# Patient Record
Sex: Female | Born: 2000 | Hispanic: Yes | Marital: Single | Smoking: Former smoker
Health system: Southern US, Community
[De-identification: ages and names within clinical notes are randomized; demographics above are authoritative.]

## PROBLEM LIST (undated history)

## (undated) ENCOUNTER — Inpatient Hospital Stay (HOSPITAL_COMMUNITY): Payer: Self-pay

## (undated) DIAGNOSIS — O039 Complete or unspecified spontaneous abortion without complication: Secondary | ICD-10-CM

## (undated) HISTORY — PX: THROAT SURGERY: SHX803

## (undated) HISTORY — PX: TONSILLECTOMY: SUR1361

---
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
## 2019-02-04 ENCOUNTER — Encounter (HOSPITAL_BASED_OUTPATIENT_CLINIC_OR_DEPARTMENT_OTHER): Payer: Self-pay | Admitting: *Deleted

## 2019-02-04 ENCOUNTER — Other Ambulatory Visit: Payer: Self-pay

## 2019-02-04 ENCOUNTER — Emergency Department (HOSPITAL_BASED_OUTPATIENT_CLINIC_OR_DEPARTMENT_OTHER): Payer: Commercial Managed Care - PPO

## 2019-02-04 ENCOUNTER — Emergency Department (HOSPITAL_BASED_OUTPATIENT_CLINIC_OR_DEPARTMENT_OTHER)
Admission: EM | Admit: 2019-02-04 | Discharge: 2019-02-04 | Disposition: A | Discharge status: Home / Self Care | Payer: Commercial Managed Care - PPO | Attending: Emergency Medicine | Admitting: Emergency Medicine

## 2019-02-04 DIAGNOSIS — O4691 Antepartum hemorrhage, unspecified, first trimester: Secondary | ICD-10-CM | POA: Diagnosis present

## 2019-02-04 DIAGNOSIS — Z3A01 Less than 8 weeks gestation of pregnancy: Secondary | ICD-10-CM | POA: Insufficient documentation

## 2019-02-04 DIAGNOSIS — O209 Hemorrhage in early pregnancy, unspecified: Secondary | ICD-10-CM

## 2019-02-04 DIAGNOSIS — Z79899 Other long term (current) drug therapy: Secondary | ICD-10-CM | POA: Insufficient documentation

## 2019-02-04 DIAGNOSIS — Z8759 Personal history of other complications of pregnancy, childbirth and the puerperium: Secondary | ICD-10-CM | POA: Diagnosis not present

## 2019-02-04 DIAGNOSIS — O4690 Antepartum hemorrhage, unspecified, unspecified trimester: Secondary | ICD-10-CM

## 2019-02-04 HISTORY — DX: Complete or unspecified spontaneous abortion without complication: O03.9

## 2019-02-04 LAB — WET PREP, GENITAL
Clue Cells Wet Prep HPF POC: NONE SEEN
Sperm: NONE SEEN
Trich, Wet Prep: NONE SEEN
Yeast Wet Prep HPF POC: NONE SEEN

## 2019-02-04 LAB — URINALYSIS, MICROSCOPIC (REFLEX)

## 2019-02-04 LAB — CBC
HCT: 38.9 % (ref 36.0–46.0)
Hemoglobin: 12.3 g/dL (ref 12.0–15.0)
MCH: 27.4 pg (ref 26.0–34.0)
MCHC: 31.6 g/dL (ref 30.0–36.0)
MCV: 86.6 fL (ref 80.0–100.0)
Platelets: 191 10*3/uL (ref 150–400)
RBC: 4.49 MIL/uL (ref 3.87–5.11)
RDW: 13.3 % (ref 11.5–15.5)
WBC: 7.3 10*3/uL (ref 4.0–10.5)
nRBC: 0 % (ref 0.0–0.2)

## 2019-02-04 LAB — COMPREHENSIVE METABOLIC PANEL
ALT: 20 U/L (ref 0–44)
AST: 16 U/L (ref 15–41)
Albumin: 3.7 g/dL (ref 3.5–5.0)
Alkaline Phosphatase: 34 U/L — ABNORMAL LOW (ref 38–126)
Anion gap: 10 (ref 5–15)
BUN: 16 mg/dL (ref 6–20)
CO2: 23 mmol/L (ref 22–32)
Calcium: 8.9 mg/dL (ref 8.9–10.3)
Chloride: 106 mmol/L (ref 98–111)
Creatinine, Ser: 0.77 mg/dL (ref 0.44–1.00)
GFR calc Af Amer: >60 mL/min (ref >60)
GFR calc non Af Amer: >60 mL/min (ref >60)
Glucose, Bld: 100 mg/dL — ABNORMAL HIGH (ref 70–99)
Potassium: 3.1 mmol/L — ABNORMAL LOW (ref 3.5–5.1)
Sodium: 139 mmol/L (ref 135–145)
Total Bilirubin: 0.4 mg/dL (ref 0.3–1.2)
Total Protein: 6.5 g/dL (ref 6.5–8.1)

## 2019-02-04 LAB — URINALYSIS, ROUTINE W REFLEX MICROSCOPIC
Bilirubin Urine: NEGATIVE
Glucose, UA: NEGATIVE mg/dL
Ketones, ur: NEGATIVE mg/dL
Leukocytes,Ua: NEGATIVE
Nitrite: NEGATIVE
Protein, ur: NEGATIVE mg/dL
Specific Gravity, Urine: >1.03 — ABNORMAL HIGH (ref 1.005–1.030)
pH: 6 (ref 5.0–8.0)

## 2019-02-04 LAB — PREGNANCY, URINE: Preg Test, Ur: POSITIVE — AB

## 2019-02-04 LAB — HCG, QUANTITATIVE, PREGNANCY: hCG, Beta Chain, Quant, S: 917 m[IU]/mL — ABNORMAL HIGH (ref <5)

## 2019-02-04 NOTE — ED Notes (Signed)
Patient transported to Ultrasound 

## 2019-02-04 NOTE — Discharge Instructions (Signed)
Your vaginal bleeding could be normal vaginal bleeding, or a spontaneous abortion.  You will need to follow-up with a OB/GYN within the next 2 days so they can perform further work-up to see if you still have a viable pregnancy.  In the meantime if you are experiencing worsening bleeding, fever, abdominal pain these would be reason to come back to the emergency department or talk to your OB/GYN.

## 2019-02-04 NOTE — ED Provider Notes (Signed)
[facility] EMERGENCY DEPARTMENT Provider Note   CSN: [id number] Arrival date & time: 02/04/19  1802    History   Chief Complaint Chief Complaint  Patient presents with   Vaginal Bleeding    HPI Lindsey Blanchard is a 18 y.o. female.     Patient was an 18 year old female who [facility] because she had been 1 week late on her menstrual cycle and was found to have a positive pregnancy test.  Today she noticed some blood on her toilet paper when she use the bathroom so she came to the emergency department.  In the emergency department when giving a sample of urine she noted blood on the toilet paper again.  She denies abdominal pain, dysuria, vaginal discharge, flank pain, fever/chills, vomiting.  She does endorse a month to 2 months of "nausea and weakness".  Patient has been trying to get pregnant and currently is in a relationship with her boyfriend whom she lives with.  She had a spontaneous abortion in November of last year.  These are the only 2 times she has been pregnant.     Past Medical History:  Diagnosis Date   Miscarriage     Patient Active Problem List   Diagnosis Date Noted   Missed abortion 06/23/2018    Past Surgical History:  Procedure Laterality Date   THROAT SURGERY     TONSILLECTOMY       OB History    Gravida  1   Para      Term      Preterm      AB      Living        SAB      TAB      Ectopic      Multiple      Live Births               Home Medications    Prior to Admission medications   Medication Sig Start Date End Date Taking? Authorizing Provider  ibuprofen (ADVIL,MOTRIN) 800 MG tablet Take 1 tablet (800 mg total) by mouth 3 (three) times daily. 06/09/18   Wende Mott, CNM  norgestimate-ethinyl estradiol (ORTHO-CYCLEN,SPRINTEC,PREVIFEM) 0.25-35 MG-MCG tablet Take 1 tablet by mouth daily. 06/30/18   Starr Lake, CNM  oxyCODONE-acetaminophen  (PERCOCET/ROXICET) 5-325 MG tablet Take 2 tablets by mouth every 4 (four) hours as needed for severe pain. Patient not taking: Reported on 06/30/2018 06/09/18   Wende Mott, CNM  Prenatal Vit-Fe Fumarate-FA (PRENATAL MULTIVITAMIN) TABS tablet Take 1 tablet by mouth daily at 12 noon.    [provider]    Family History Family History  Problem Relation Age of Onset   Diabetes Mother     Social History Social History   Tobacco Use   Smoking status: Former Smoker   Smokeless tobacco: Never Used   Tobacco comment: quit with pregnancy  Substance Use Topics   Alcohol use: Never    Frequency: Never   Drug use: Never     Allergies   Patient has no known allergies.   Review of Systems Review of Systems  Constitutional: Negative for chills and fever.  HENT: Negative for sore throat.   Eyes: Negative for visual disturbance.  Respiratory: Negative for cough and shortness of breath.   Cardiovascular: Positive for chest pain.  Gastrointestinal: Positive for nausea. Negative for abdominal pain, diarrhea and vomiting.  Genitourinary: Positive for vaginal bleeding. Negative for dysuria,  genital sores, pelvic pain, vaginal discharge and vaginal pain.  Musculoskeletal: Negative for neck pain.  Skin: Negative for rash.  Neurological: Positive for weakness. Negative for dizziness and syncope.  Psychiatric/Behavioral: Negative for dysphoric mood.     Physical Exam Updated Vital Signs BP 121/62 (BP Location: Right Arm)    Pulse 78    Temp 98.7 F (37.1 C) (Oral)    Resp 18    Ht 5\' 5"  (1.651 m)    Wt 124.3 kg    LMP 12/23/2017    SpO2 100%    BMI 45.60 kg/m   Physical Exam Constitutional:      General: She is not in acute distress.    Appearance: Normal appearance. She is obese. She is not ill-appearing.  HENT:     Head: Normocephalic and atraumatic.     Nose: Nose normal. No congestion or rhinorrhea.     Mouth/Throat:     Mouth: Mucous membranes are moist.      Pharynx: Oropharynx is clear. No oropharyngeal exudate.  Eyes:     Extraocular Movements: Extraocular movements intact.     Conjunctiva/sclera: Conjunctivae normal.     Pupils: Pupils are equal, round, and reactive to light.  Neck:     Musculoskeletal: Normal range of motion and neck supple.  Cardiovascular:     Rate and Rhythm: Normal rate and regular rhythm.     Pulses: Normal pulses.     Heart sounds: No murmur.  Pulmonary:     Effort: Pulmonary effort is normal. No respiratory distress.     Breath sounds: Normal breath sounds. No wheezing.  Abdominal:     General: Bowel sounds are normal. There is no distension.     Palpations: There is no mass.     Tenderness: There is no abdominal tenderness. There is no right CVA tenderness, left CVA tenderness or guarding.     Comments: Large pannus  Genitourinary:    Vagina: No vaginal discharge.     Comments: Blood in the vaginal vault.  But appears to be coming from the cervical loss which appears to be closed. Musculoskeletal:        General: No swelling or tenderness.  Skin:    General: Skin is warm and dry.     Coloration: Skin is not pale.  Neurological:     General: No focal deficit present.     Mental Status: She is alert and oriented to person, place, and time.     Cranial Nerves: No cranial nerve deficit.     Motor: No weakness.  Psychiatric:        Mood and Affect: Mood normal.        Behavior: Behavior normal.      ED Treatments / Results  Labs (all labs ordered are listed, but only abnormal results are displayed) Labs Reviewed  WET PREP, GENITAL - Abnormal; Notable for the following components:      Result Value   WBC, Wet Prep HPF POC FEW (*)    All other components within normal limits  URINALYSIS, ROUTINE W REFLEX MICROSCOPIC - Abnormal; Notable for the following components:   Specific Gravity, Urine >1.030 (*)    Hgb urine dipstick LARGE (*)    All other components within normal limits  PREGNANCY, URINE -  Abnormal; Notable for the following components:   Preg Test, Ur POSITIVE (*)    All other components within normal limits  URINALYSIS, MICROSCOPIC (REFLEX) - Abnormal; Notable for the following components:  Bacteria, UA FEW (*)    All other components within normal limits  HCG, QUANTITATIVE, PREGNANCY - Abnormal; Notable for the following components:   hCG, Beta Chain, Quant, S 917 (*)    All other components within normal limits  COMPREHENSIVE METABOLIC PANEL - Abnormal; Notable for the following components:   Potassium 3.1 (*)    Glucose, Bld 100 (*)    Alkaline Phosphatase 34 (*)    All other components within normal limits  CBC  GC/CHLAMYDIA PROBE AMP ([place]) NOT AT [facility]    EKG None  Radiology [place] Ob Comp < 14 Wks  Result Date: 02/04/2019 CLINICAL DATA:  Vaginal bleeding EXAM: OBSTETRIC <14 WK US AND TRANSVAGINAL OB US TECHNIQUE: Both transabdominal and transvaginal ultrasound examinations were performed for complete evaluation of the gestation as well as the maternal uterus, adnexal regions, and pelvic cul-de-sac. Transvaginal technique was performed to assess early pregnancy. COMPARISON:  None. FINDINGS: Intrauterine gestational sac: Single Yolk sac:  Not visualized Embryo:  Not visualized Cardiac Activity: Not visualized Heart Rate:   bpm MSD: 2.9 mm   5 w   0 d CRL:    mm    w    d                  US EDC: Subchorionic hemorrhage:  None visualized. Maternal uterus/adnexae: No adnexal mass or free fluid. IMPRESSION: Probable early intrauterine gestational sac, but no yolk sac, fetal pole, or cardiac activity yet visualized. Recommend follow-up quantitative B-HCG levels and follow-up US in 14 days to assess viability. This recommendation follows SRU consensus guidelines: Diagnostic Criteria for Nonviable Pregnancy Early in the First Trimester. Malva Limes Engl J Med 2013; 161:0960-45; 369:1443-51. Electronically Signed   By: Charlett NoseKevin  Dover M.D.   On: 02/04/2019 21:12   [place] Ob Transvaginal  Result Date:  02/04/2019 CLINICAL DATA:  Vaginal bleeding EXAM: OBSTETRIC <14 WK US AND TRANSVAGINAL OB US TECHNIQUE: Both transabdominal and transvaginal ultrasound examinations were performed for complete evaluation of the gestation as well as the maternal uterus, adnexal regions, and pelvic cul-de-sac. Transvaginal technique was performed to assess early pregnancy. COMPARISON:  None. FINDINGS: Intrauterine gestational sac: Single Yolk sac:  Not visualized Embryo:  Not visualized Cardiac Activity: Not visualized Heart Rate:   bpm MSD: 2.9 mm   5 w   0 d CRL:    mm    w    d                  US EDC: Subchorionic hemorrhage:  None visualized. Maternal uterus/adnexae: No adnexal mass or free fluid. IMPRESSION: Probable early intrauterine gestational sac, but no yolk sac, fetal pole, or cardiac activity yet visualized. Recommend follow-up quantitative B-HCG levels and follow-up US in 14 days to assess viability. This recommendation follows SRU consensus guidelines: Diagnostic Criteria for Nonviable Pregnancy Early in the First Trimester. Malva Limes Engl J Med 2013; 409:8119-14; 369:1443-51. Electronically Signed   By: Charlett NoseKevin  Dover M.D.   On: 02/04/2019 21:12    Procedures Procedures (including critical care time)  Medications Ordered in ED Medications - No data to display   Initial Impression / Assessment and Plan / ED Course  I have reviewed the triage vital signs and the nursing notes.  Pertinent labs & imaging results that were available during my care of the patient were reviewed by me and considered in my medical decision making (see chart for details).        Patient is an 18 year old female who presented  to the emergency department after noticing some blood on the toilet paper earlier this afternoon.  She had a positive pregnancy test 1 week ago.  She denies any abdominal pain, nausea, vomiting or fevers.  No vaginal discharge, no flank pain.  Patient not complaining of dysuria.  Her vital signs were within normal limits and  stable.  Abdominal exam was benign.  Vaginal exam showed small amounts of blood in the vaginal vault, and blood at the cervical loss, which appeared closed.  Beta-hCG was 917, CBC was normal, CMP did not show any electrolyte disturbance or liver dysfunction.  Wet prep and urinalysis was negative for infection.  Transvaginal ultrasound showed what was likely an intrauterine gestational sac, but without yolk sac or pole.  Based on this information I cannot say for certain if this is normal bleeding in early pregnancy or at the beginning of a spontaneous abortion, but it does appear that it is not a ectopic pregnancy based on ultrasound and physical exam.  Patient does not currently have an obstetrician.  Advised patient to try to find a obstetrician so that she can follow-up in 2 days with a repeat beta hCG quant to help determine if pregnancy is viable.  Gave patient information of some local obstetricians and advised patient that [facility] the beta-hCG measurement as 2 days from now would be a Saturday and many offices would likely be closed.  Advised patient to come back if she starts developing abdominal pain, fever, worsening of her bleeding.  Final Clinical Impressions(s) / ED Diagnoses   Final diagnoses:  Vaginal bleeding in pregnancy    ED Discharge Orders    None       Sandre Kittylson, Kortnee Bas K, MD 02/05/19 1049    Alvira MondaySchlossman, Erin, MD 02/06/19 [phone]

## 2019-02-04 NOTE — ED Notes (Signed)
Pt states scant amount of  Vaginal  bleeding.

## 2019-02-04 NOTE — ED Triage Notes (Signed)
Pt c/o vaginal bleeding x 1 hr, [facility] .

## 2019-02-05 ENCOUNTER — Encounter (HOSPITAL_COMMUNITY): Payer: Self-pay | Admitting: *Deleted

## 2019-02-05 ENCOUNTER — Inpatient Hospital Stay (HOSPITAL_COMMUNITY)
Admission: AD | Admit: 2019-02-05 | Discharge: 2019-02-05 | Disposition: A | Discharge status: Home / Self Care | Payer: Commercial Managed Care - PPO | Attending: Obstetrics and Gynecology | Admitting: Obstetrics and Gynecology

## 2019-02-05 ENCOUNTER — Inpatient Hospital Stay (HOSPITAL_COMMUNITY): Payer: Commercial Managed Care - PPO

## 2019-02-05 DIAGNOSIS — Z3A01 Less than 8 weeks gestation of pregnancy: Secondary | ICD-10-CM | POA: Diagnosis not present

## 2019-02-05 DIAGNOSIS — Z87891 Personal history of nicotine dependence: Secondary | ICD-10-CM | POA: Diagnosis not present

## 2019-02-05 DIAGNOSIS — R109 Unspecified abdominal pain: Secondary | ICD-10-CM | POA: Diagnosis not present

## 2019-02-05 DIAGNOSIS — M549 Dorsalgia, unspecified: Secondary | ICD-10-CM | POA: Diagnosis not present

## 2019-02-05 DIAGNOSIS — O209 Hemorrhage in early pregnancy, unspecified: Secondary | ICD-10-CM | POA: Insufficient documentation

## 2019-02-05 DIAGNOSIS — O26891 Other specified pregnancy related conditions, first trimester: Secondary | ICD-10-CM | POA: Insufficient documentation

## 2019-02-05 DIAGNOSIS — O3680X0 Pregnancy with inconclusive fetal viability, not applicable or unspecified: Secondary | ICD-10-CM | POA: Insufficient documentation

## 2019-02-05 LAB — URINALYSIS, ROUTINE W REFLEX MICROSCOPIC
Bilirubin Urine: NEGATIVE
Glucose, UA: NEGATIVE mg/dL
Ketones, ur: NEGATIVE mg/dL
Leukocytes,Ua: NEGATIVE
Nitrite: NEGATIVE
Protein, ur: 30 mg/dL — AB
RBC / HPF: >50 RBC/hpf — ABNORMAL HIGH (ref 0–5)
Specific Gravity, Urine: 1.028 (ref 1.005–1.030)
pH: 6 (ref 5.0–8.0)

## 2019-02-05 LAB — CBC
HCT: 38.5 % (ref 36.0–46.0)
Hemoglobin: 12.3 g/dL (ref 12.0–15.0)
MCH: 27.3 pg (ref 26.0–34.0)
MCHC: 31.9 g/dL (ref 30.0–36.0)
MCV: 85.4 fL (ref 80.0–100.0)
Platelets: 200 10*3/uL (ref 150–400)
RBC: 4.51 MIL/uL (ref 3.87–5.11)
RDW: 13.3 % (ref 11.5–15.5)
WBC: 6.2 10*3/uL (ref 4.0–10.5)
nRBC: 0 % (ref 0.0–0.2)

## 2019-02-05 LAB — HCG, QUANTITATIVE, PREGNANCY: hCG, Beta Chain, Quant, S: 1014 m[IU]/mL — ABNORMAL HIGH (ref <5)

## 2019-02-05 NOTE — MAU Note (Signed)
.   Lindsey Blanchard is a 18 y.o. at 109w2d here in MAU reporting:  Bright red vaginal bleeding, states she went to [facility] yesterday and they did an U/S but did not tell her anything. Reports lower abdominal cramping and constant back pain Onset of complaint: Yesterday at 5pm Pain score: 8 Vitals:   02/05/19 1616  BP: 125/73  Pulse: 75  Resp: 18  Temp: 97.8 F (36.6 C)  SpO2: 100%      Lab orders placed from triage: UA

## 2019-02-05 NOTE — Discharge Instructions (Signed)
Vaginal Bleeding During Pregnancy, First Trimester ° °A small amount of bleeding from the vagina (spotting) is relatively common during early pregnancy. It usually stops on its own. Various things may cause bleeding or spotting during early pregnancy. Some bleeding may be related to the pregnancy, and some may not. In many cases, the bleeding is normal and is not a problem. However, bleeding can also be a sign of something serious. Be sure to tell your health care provider about any vaginal bleeding right away. °Some possible causes of vaginal bleeding during the first trimester include: °· Infection or inflammation of the cervix. °· Growths (polyps) on the cervix. °· Miscarriage or threatened miscarriage. °· Pregnancy tissue developing outside of the uterus (ectopic pregnancy). °· A mass of tissue developing in the uterus due to an egg being fertilized incorrectly (molar pregnancy). °Follow these instructions at home: °Activity °· Follow instructions from your health care provider about limiting your activity. Ask what activities are safe for you. °· If needed, make plans for someone to help with your regular activities. °· Do not have sex or orgasms until your health care provider says that this is safe. °General instructions °· Take over-the-counter and prescription medicines only as told by your health care provider. °· Pay attention to any changes in your symptoms. °· Do not use tampons or douche. °· Write down how many pads you use each day, how often you change pads, and how soaked (saturated) they are. °· If you pass any tissue from your vagina, save the tissue so you can show it to your health care provider. °· Keep all follow-up visits as told by your health care provider. This is important. °Contact a health care provider if: °· You have vaginal bleeding during any part of your pregnancy. °· You have cramps or labor pains. °· You have a fever. °Get help right away if: °· You have severe cramps in your  back or abdomen. °· You pass large clots or a large amount of tissue from your vagina. °· Your bleeding increases. °· You feel light-headed or weak, or you faint. °· You have chills. °· You are leaking fluid or have a gush of fluid from your vagina. °Summary °· A small amount of bleeding (spotting) from the vagina is relatively common during early pregnancy. °· Various things may cause bleeding or spotting in early pregnancy. °· Be sure to tell your health care provider about any vaginal bleeding right away. °This information is not intended to replace advice given to you by your health care provider. Make sure you discuss any questions you have with your health care provider. °Document Released: 04/17/2005 Document Revised: 10/27/2018 Document Reviewed: 10/10/2016 °Elsevier Patient Education © 2020 Elsevier Inc. ° °

## 2019-02-05 NOTE — MAU Note (Signed)
They told her yesterday to come back if the bleeding got worse. She also has an appointment tomorrow for repeat HCG. Today bleeding is bright red, yesterday it was brown. Is wearing a pad; bleeding is like period bleeding and has cramping and back pain.

## 2019-02-05 NOTE — MAU Provider Note (Signed)
Chief Complaint: Vaginal Bleeding   First Provider Initiated Contact with Patient 02/05/19 1709     *Spanish interpreter used for this visit*  SUBJECTIVE  HPI: Lindsey Blanchard is a 18 y.o. G2P0010 at 6023w2d who presents to [facility] reporting vaginal bleeding & back pain. Was seen in [facility] last night for same complaint. States they didn't tell her what was going on. Does not have follow up scheduled. States bleeding has become heavier today. Not saturating pads or passing clots. Endorses abdominal & back pain. Last intercourse was 2 days ago.   Location: abdomen & back Quality: cramping Severity: 9/10 on pain scale Duration: 2 days Timing: intermittent Modifying factors: none Associated signs and symptoms: vaginal bleeding  Past Medical History:  Diagnosis Date  . Miscarriage    OB History  Gravida Para Term Preterm AB Living  2       1    SAB TAB Ectopic Multiple Live Births  1            # Outcome Date GA Lbr Len/2nd Weight Sex Delivery Anes PTL Lv  2 Current           1 SAB 05/2018           Past Surgical History:  Procedure Laterality Date  . THROAT SURGERY    . TONSILLECTOMY     Social History   Socioeconomic History  . Marital status: Single    Spouse name: Not on file  . Number of children: Not on file  . Years of education: Not on file  . Highest education level: Not on file  Occupational History  . Not on file  Social Needs  . Financial resource strain: Not on file  . Food insecurity    Worry: Not on file    Inability: Not on file  . Transportation needs    Medical: Not on file    Non-medical: Not on file  Tobacco Use  . Smoking status: Former Games developermoker  . Smokeless tobacco: Never Used  . Tobacco comment: quit with pregnancy  Substance and Sexual Activity  . Alcohol use: Never    Frequency: Never  . Drug use: Never  . Sexual activity: Yes  Lifestyle  . Physical activity    Days per week: Not on file    Minutes per session: Not on  file  . Stress: Not on file  Relationships  . Social Musicianconnections    Talks on phone: Not on file    Gets together: Not on file    Attends religious service: Not on file    Active member of club or organization: Not on file    Attends meetings of clubs or organizations: Not on file    Relationship status: Not on file  . Intimate partner violence    Fear of current or ex partner: Not on file    Emotionally abused: Not on file    Physically abused: Not on file    Forced sexual activity: Not on file  Other Topics Concern  . Not on file  Social History Narrative  . Not on file   Family History  Problem Relation Age of Onset  . Diabetes Mother    No current facility-administered medications on file prior to encounter.    Current Outpatient Medications on File Prior to Encounter  Medication Sig Dispense Refill  . Prenatal Vit-Fe Fumarate-FA (PRENATAL MULTIVITAMIN) TABS tablet Take 1 tablet by mouth daily at 12 noon.    Marland Kitchen. ibuprofen (ADVIL,MOTRIN)  800 MG tablet Take 1 tablet (800 mg total) by mouth 3 (three) times daily. 30 tablet 0  . norgestimate-ethinyl estradiol (ORTHO-CYCLEN,SPRINTEC,PREVIFEM) 0.25-35 MG-MCG tablet Take 1 tablet by mouth daily. 1 Package 11  . oxyCODONE-acetaminophen (PERCOCET/ROXICET) 5-325 MG tablet Take 2 tablets by mouth every 4 (four) hours as needed for severe pain. (Patient not taking: Reported on 06/30/2018) 10 tablet 0   No Known Allergies  I have reviewed patient's Past Medical Hx, Surgical Hx, Family Hx, Social Hx, medications and allergies.   Review of Systems  Constitutional: Negative.   Gastrointestinal: Positive for abdominal pain.  Genitourinary: Positive for vaginal bleeding.  Musculoskeletal: Positive for back pain.    OBJECTIVE Patient Vitals for the past 24 hrs:  BP Temp Pulse Resp SpO2 Weight  02/05/19 1655 (!) 118/51 - 62 - - -  02/05/19 1616 125/73 97.8 F (36.6 C) 75 18 100 % 125.2 kg   Constitutional: Well-developed,  well-nourished female in no acute distress.  Cardiovascular: normal rate & rhythm, no murmur Respiratory: normal rate and effort. Lung sounds clear throughout GI: Abd soft, non-tender, Pos BS x 4. No guarding or rebound tenderness MS: Extremities nontender, no edema, normal ROM Neurologic: Alert and oriented x 4.  GU:  Scant amount of dark red blood   LAB RESULTS Results for orders placed or performed during the hospital encounter of 02/05/19 (from the past 24 hour(s))  Urinalysis, Routine w reflex microscopic     Status: Abnormal   Collection Time: 02/05/19  5:13 PM  Result Value Ref Range   Color, Urine YELLOW YELLOW   APPearance HAZY (A) CLEAR   Specific Gravity, Urine 1.028 1.005 - 1.030   pH 6.0 5.0 - 8.0   Glucose, UA NEGATIVE NEGATIVE mg/dL   Hgb urine dipstick LARGE (A) NEGATIVE   Bilirubin Urine NEGATIVE NEGATIVE   Ketones, ur NEGATIVE NEGATIVE mg/dL   Protein, ur 30 (A) NEGATIVE mg/dL   Nitrite NEGATIVE NEGATIVE   Leukocytes,Ua NEGATIVE NEGATIVE   RBC / HPF >50 (H) 0 - 5 RBC/hpf   WBC, UA 0-5 0 - 5 WBC/hpf   Bacteria, UA RARE (A) NONE SEEN   Squamous Epithelial / LPF 0-5 0 - 5   Mucus PRESENT   CBC     Status: None   Collection Time: 02/05/19  5:55 PM  Result Value Ref Range   WBC 6.2 4.0 - 10.5 K/uL   RBC 4.51 3.87 - 5.11 MIL/uL   Hemoglobin 12.3 12.0 - 15.0 g/dL   HCT 38.5 36.0 - 46.0 %   MCV 85.4 80.0 - 100.0 fL   MCH 27.3 26.0 - 34.0 pg   MCHC 31.9 30.0 - 36.0 g/dL   RDW 13.3 11.5 - 15.5 %   Platelets 200 150 - 400 K/uL   nRBC 0.0 0.0 - 0.2 %  hCG, quantitative, pregnancy     Status: Abnormal   Collection Time: 02/05/19  5:55 PM  Result Value Ref Range   hCG, Beta Chain, Quant, S 1,014 (H) <5 mIU/mL    IMAGING US Ob Transvaginal  Result Date: 02/05/2019 CLINICAL DATA:  Vaginal bleeding EXAM: TRANSVAGINAL OB ULTRASOUND TECHNIQUE: Transvaginal ultrasound was performed for complete evaluation of the gestation as well as the maternal uterus, adnexal  regions, and pelvic cul-de-sac. COMPARISON:  02/04/2019 FINDINGS: Intrauterine gestational sac: Single Yolk sac:  Not visualized Embryo:  Not visualized Cardiac Activity: Not visualized Heart Rate:  bpm MSD: 3.44 mm   5 w   0 d CRL:  mm    w  d                  US EDC: Subchorionic hemorrhage:  None visualized. Maternal uterus/adnexae: No adnexal mass or free fluid. IMPRESSION: Probable early intrauterine gestational sac, but no yolk sac, fetal pole, or cardiac activity yet visualized. Recommend follow-up quantitative B-HCG levels and follow-up US in 14 days to assess viability. This recommendation follows SRU consensus guidelines: Diagnostic Criteria for Nonviable Pregnancy Early in the First Trimester. Malva Limes Engl J Med 2013; 161:0960-45; 369:1443-51. Electronically Signed   By: Charlett NoseKevin  Dover M.D.   On: 02/05/2019 18:54     MAU COURSE Orders Placed This Encounter  Procedures  . US OB Transvaginal  . Urinalysis, Routine w reflex microscopic  . CBC  . hCG, quantitative, pregnancy  . Discharge patient   No orders of the defined types were placed in this encounter.   MDM RH positive Minimal amount of bleeding  HCG last night was 917. Today it is 1014 Yesterday's ultrasound showed empty IUGS & no adnexal mass. No change on ultrasound today.  Can't r/o ectopic or SAB. Will have patient come to the office on Monday for repeat HCG  ASSESSMENT 1. Pregnancy of unknown anatomic location   2. Vaginal bleeding in pregnancy, first trimester   3. Abdominal pain during pregnancy in first trimester     PLAN Discharge home in stable condition. SAB vs ectopic precautions Scheduled for f/u HCG at [facility] on Monday  Allergies as of 02/05/2019   No Known Allergies     Medication List    STOP taking these medications   ibuprofen 800 MG tablet Commonly known as: ADVIL   norgestimate-ethinyl estradiol 0.25-35 MG-MCG tablet Commonly known as: ORTHO-CYCLEN   oxyCODONE-acetaminophen 5-325 MG tablet Commonly  known as: PERCOCET/ROXICET     TAKE these medications   prenatal multivitamin Tabs tablet Take 1 tablet by mouth daily at 12 noon.        Judeth HornLawrence, Leah Skora, NP 02/05/2019  7:17 PM

## 2019-02-08 ENCOUNTER — Other Ambulatory Visit: Payer: Self-pay

## 2019-02-08 ENCOUNTER — Ambulatory Visit (INDEPENDENT_AMBULATORY_CARE_PROVIDER_SITE_OTHER): Payer: Commercial Managed Care - PPO

## 2019-02-08 DIAGNOSIS — O3680X0 Pregnancy with inconclusive fetal viability, not applicable or unspecified: Secondary | ICD-10-CM

## 2019-02-08 LAB — BETA HCG QUANT (REF LAB): hCG Quant: 81 m[IU]/mL

## 2019-02-08 NOTE — Progress Notes (Signed)
Pt here today for STAT Beta Lab.  With spanish interpreter Britta Mccreedy., pt reports vaginal bleeding that she has to change her pad 3-4 times a day and is not having any pain.  Pt also reported that she has had a quarter size clot.  I explained to the pt that it takes at least two hours for results in which I will call her.  Pt verbalized understanding.   Received [facility] results of stat beta of 81.  Notified Dr. Ilda Basset who stated that pt is having miscarriage and to f/u in 10 days for non stat beta. With Mariel G., pt informed of provider's recommendation.  Pt reported that she would be able to come in on 02/17/19 @ 1500 for a non stat beta.  I informed pt that the difference is that the her beta levels will result in 24-48 hrs instead of two hours.  Pt verbalized understanding.    Mel Almond, RN 02/08/19

## 2019-02-09 LAB — GC/CHLAMYDIA PROBE AMP (~~LOC~~) NOT AT ARMC
Chlamydia: NEGATIVE
Neisseria Gonorrhea: NEGATIVE

## 2019-02-15 ENCOUNTER — Other Ambulatory Visit: Payer: Self-pay | Admitting: *Deleted

## 2019-02-15 DIAGNOSIS — O039 Complete or unspecified spontaneous abortion without complication: Secondary | ICD-10-CM

## 2019-02-15 NOTE — Progress Notes (Signed)
Patient seen and assessed by nursing staff during this encounter. I have reviewed the chart and agree with the documentation and plan.  Tilia Faso, MD 02/15/2019 9:59 AM    

## 2019-02-17 ENCOUNTER — Other Ambulatory Visit: Payer: Self-pay

## 2019-02-17 ENCOUNTER — Other Ambulatory Visit: Payer: Commercial Managed Care - PPO

## 2019-02-17 DIAGNOSIS — O039 Complete or unspecified spontaneous abortion without complication: Secondary | ICD-10-CM

## 2019-02-18 LAB — BETA HCG QUANT (REF LAB): hCG Quant: 2 m[IU]/mL

## 2019-02-25 ENCOUNTER — Telehealth: Payer: Self-pay

## 2019-02-25 NOTE — Telephone Encounter (Addendum)
-----   Message from Aletha Halim, MD sent at 02/18/2019  1:24 PM EDT ----- Can you let her know that her lab is negative and she should expect a period in the next 4-6 weeks and if she doesn't get one, to let us know  LM with [employer]., the provider's recommendation and if she has any questions to please call the office.

## 2020-02-16 IMAGING — US US OB COMP LESS 14 WK
1 series · 15 of 28 positions shown · non-contrast
Comparison: None

CLINICAL DATA: Vaginal bleeding affecting early pregnancy, vaginal
bleeding since last week, LMP 02/24/2018

EXAM:
OBSTETRIC <14 WK US AND TRANSVAGINAL OB US
TECHNIQUE: Both transabdominal and transvaginal ultrasound examinations were
performed for complete evaluation of the gestation as well as the
maternal uterus, adnexal regions, and pelvic cul-de-sac.
Transvaginal technique was performed to assess early pregnancy.

[Series 1: us ob comp less 14 wk · 15 of 31 slices shown]
[im 1/31]
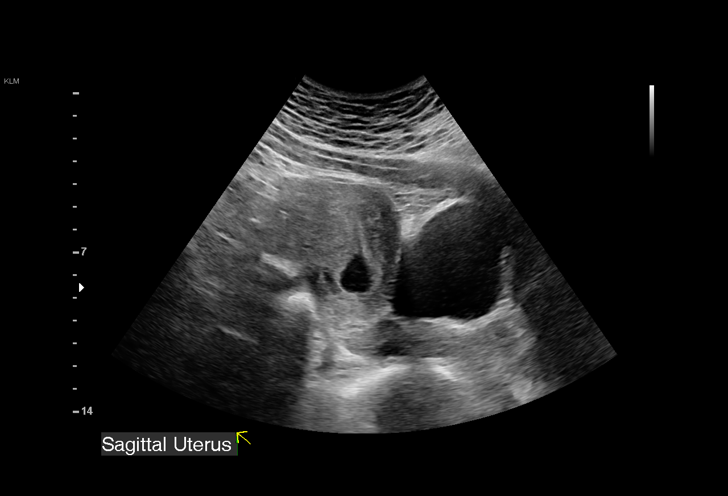
[im 3/31]
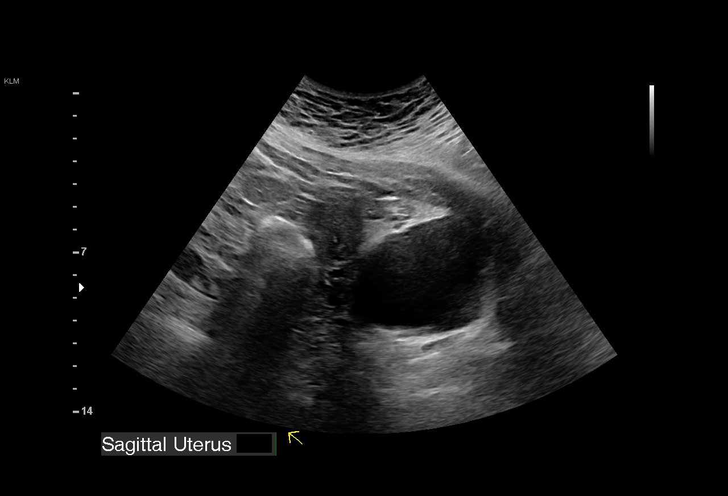
[im 5/31]
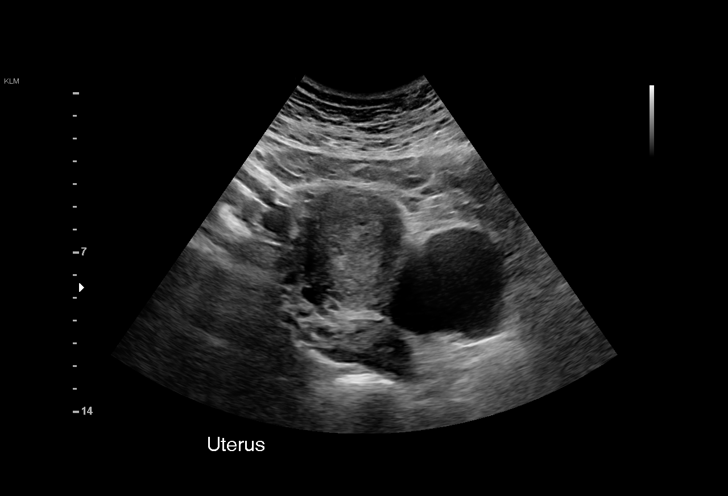
[im 7/31]
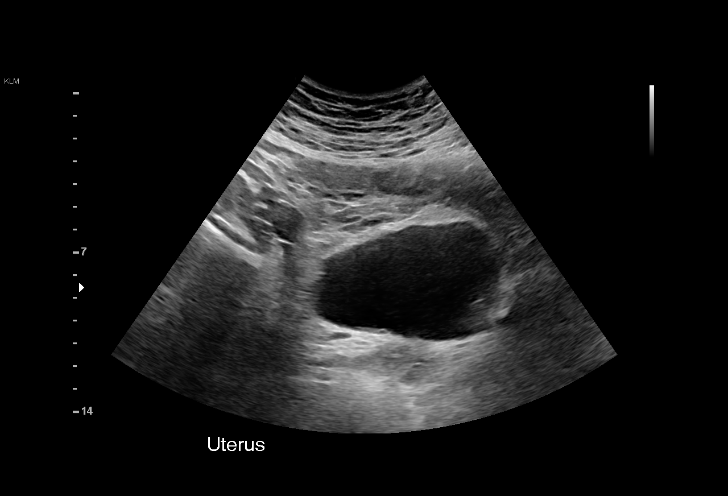
[im 9/31]
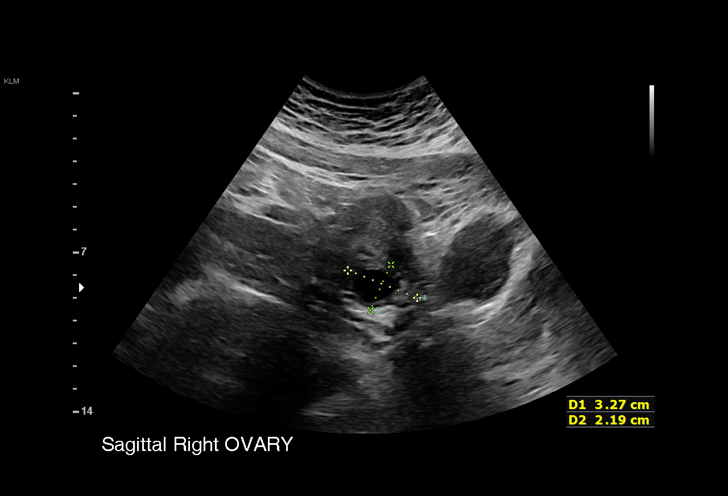
[im 12/31]
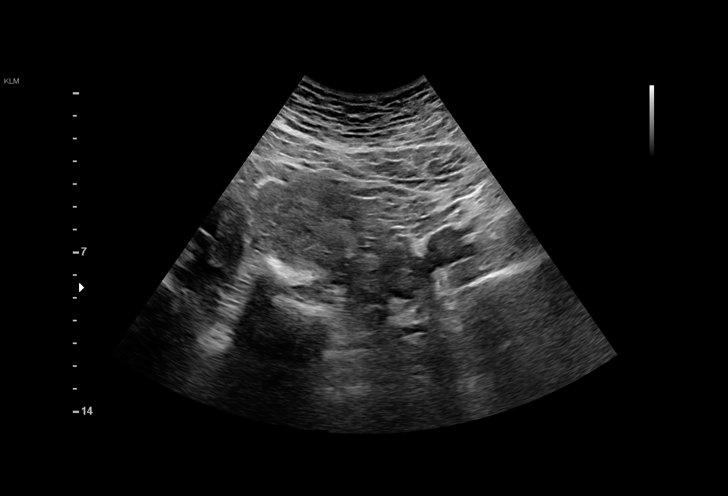
[im 14/31]
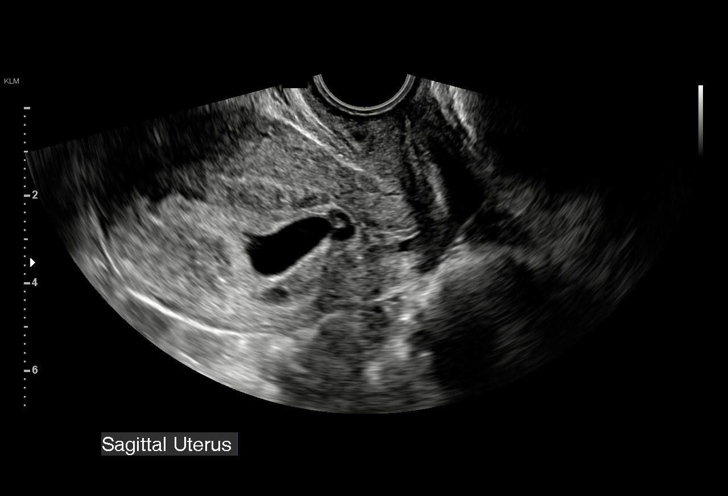
[im 16/31]
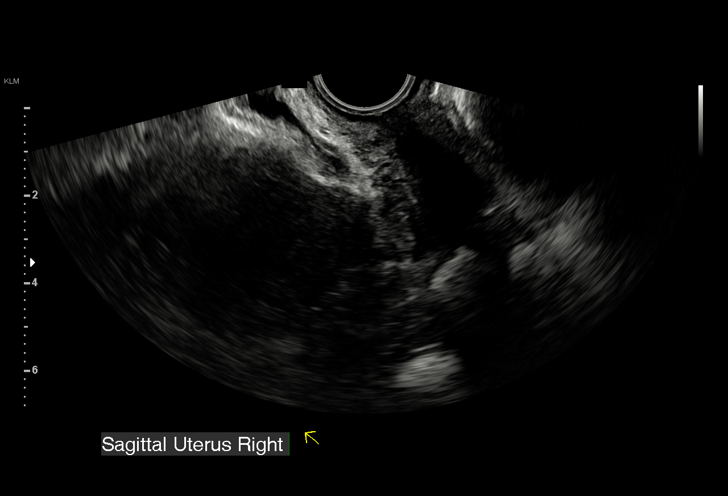
[im 17/31]
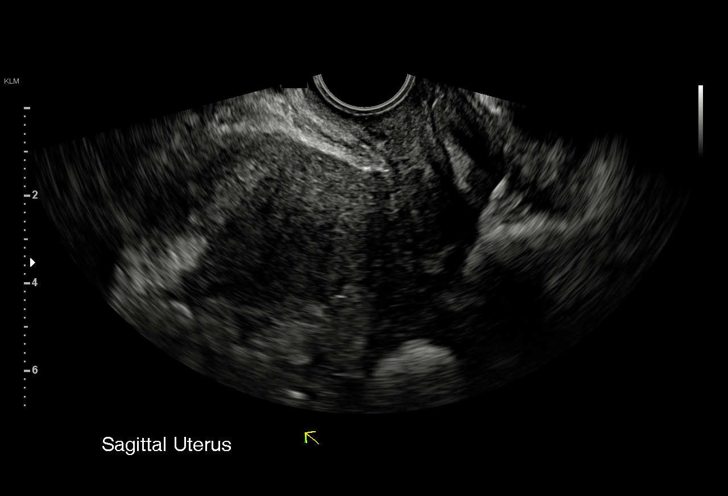
[im 19/31]
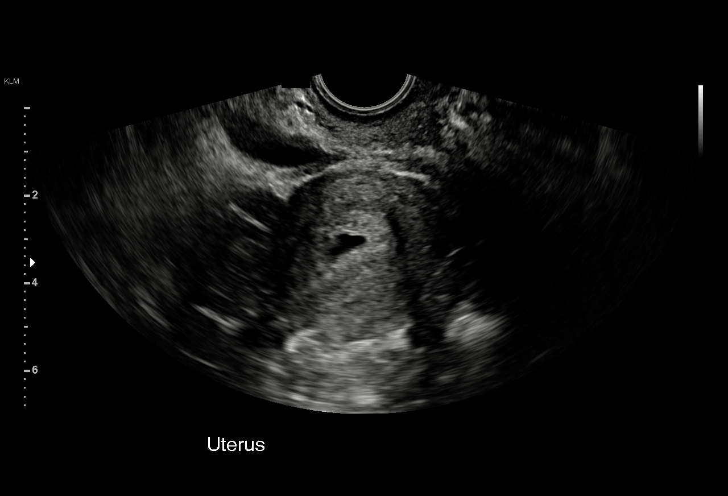
[im 22/31]
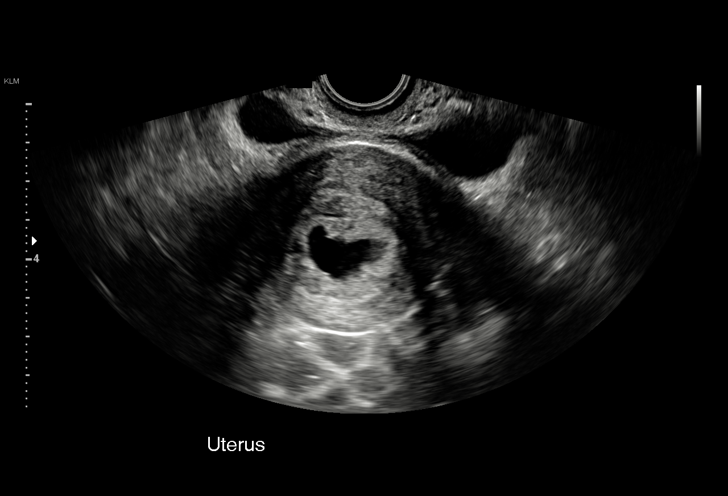
[im 24/31]
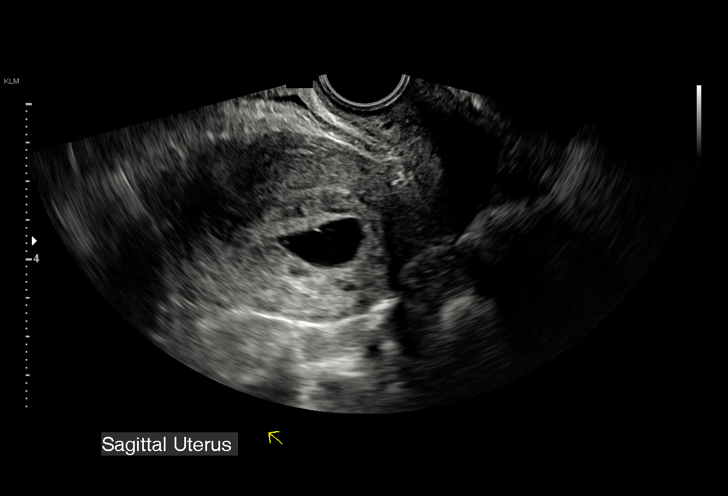
[im 26/31]
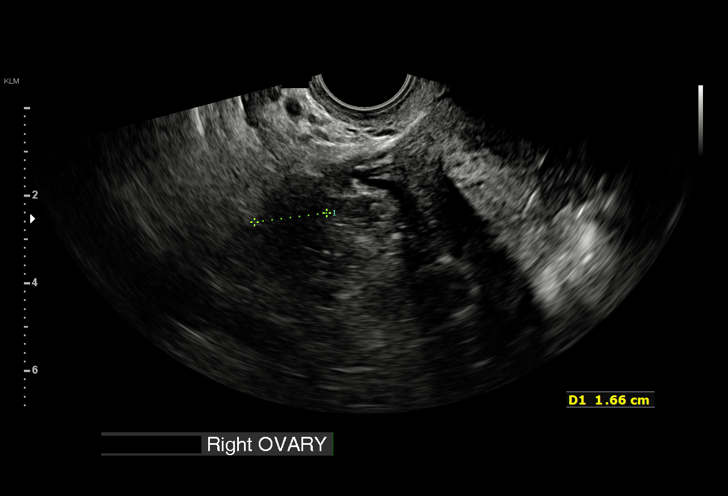
[im 28/31]
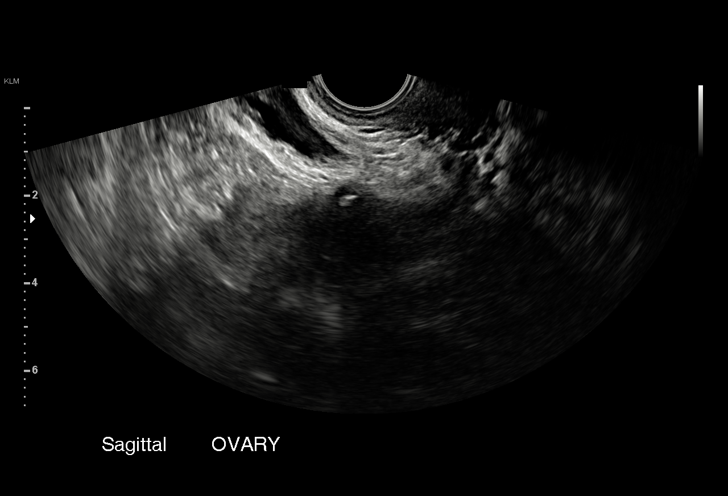
[im 31/31]
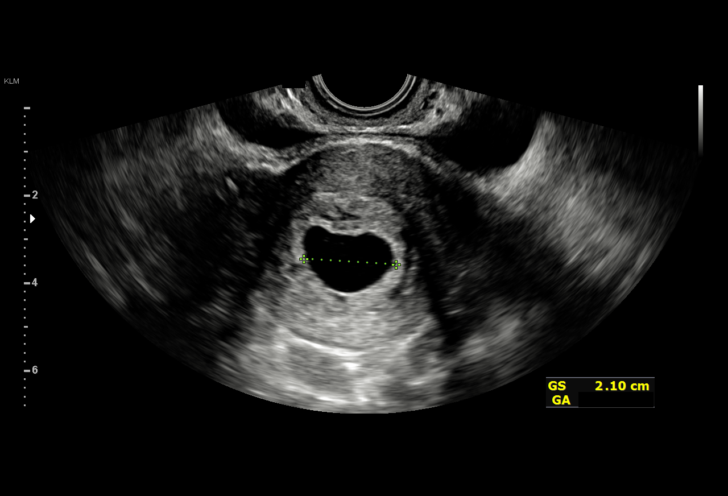

[15 of 28 positions shown; findings below may reference images not displayed]

FINDINGS: Intrauterine gestational sac: Present, single

Yolk sac:  Not definitely visualized

Embryo:  Absent

Cardiac Activity: N/A

Heart Rate: N/A  bpm

MSD: 18.7 mm   6 w   5 d

CRL:    mm    w    d                  US EDC:

Subchorionic hemorrhage:  None visualized.

Maternal uterus/adnexae:

LEFT ovary normal size and morphology, 2.0 x 2.8 x 1.8 cm.

RIGHT ovary normal size and morphology, 1.7 x 3.1 x 2.3 cm.

No free pelvic fluid or adnexal masses.
IMPRESSION: Gestational sac seen within the uterus though no yolk sac or fetal
pole are definitely visualized.

If clinically indicated, can perform follow up ultrasound in 14 days
to assess viability.

Remainder of exam unremarkable.

## 2020-10-13 IMAGING — US TRANSVAGINAL OB ULTRASOUND
1 series · 14 of 25 positions shown · non-contrast
Comparison: None.

CLINICAL DATA: Vaginal bleeding

EXAM:
OBSTETRIC <14 WK US AND TRANSVAGINAL OB US
TECHNIQUE: Both transabdominal and transvaginal ultrasound examinations were
performed for complete evaluation of the gestation as well as the
maternal uterus, adnexal regions, and pelvic cul-de-sac.
Transvaginal technique was performed to assess early pregnancy.

[Series 1: transvaginal ob ultrasound · 14 of 25 slices shown]
[im 1/25]
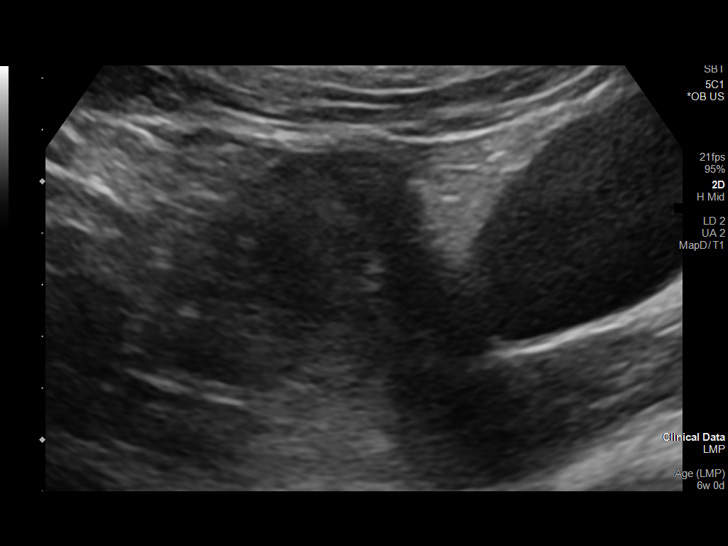
[im 3/25]
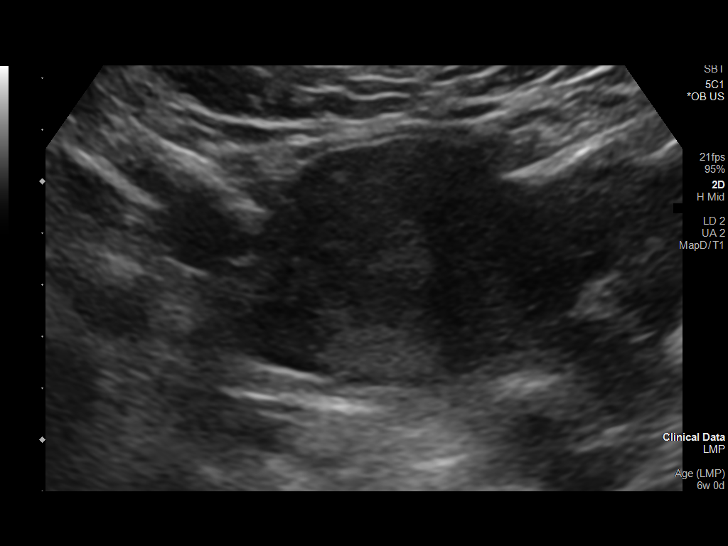
[im 5/25]
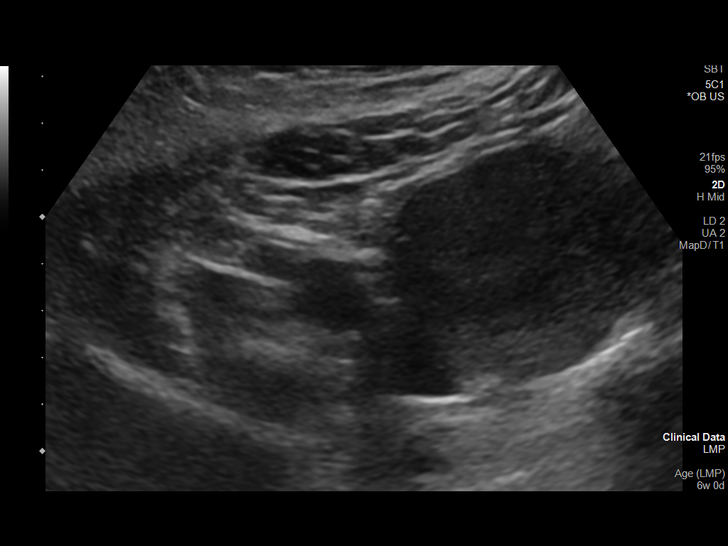
[im 7/25]
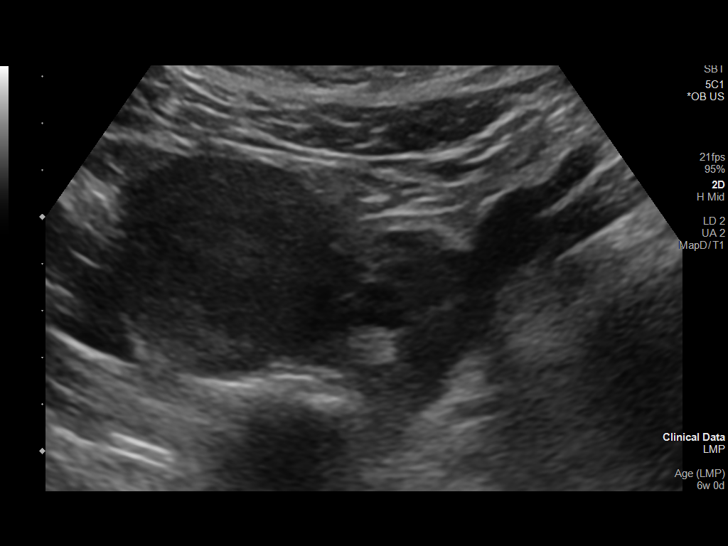
[im 9/25]
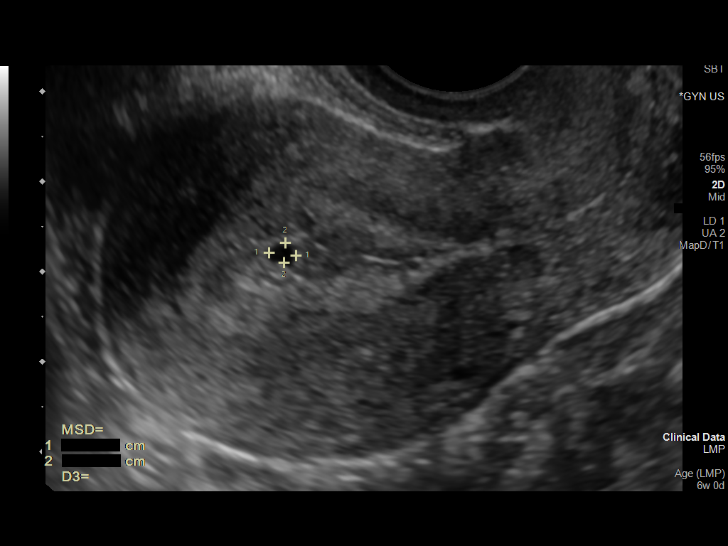
[im 10/25]
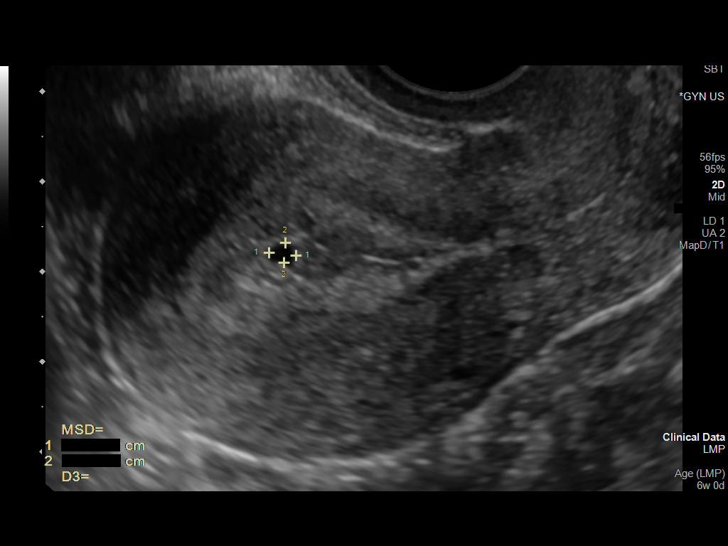
[im 12/25]
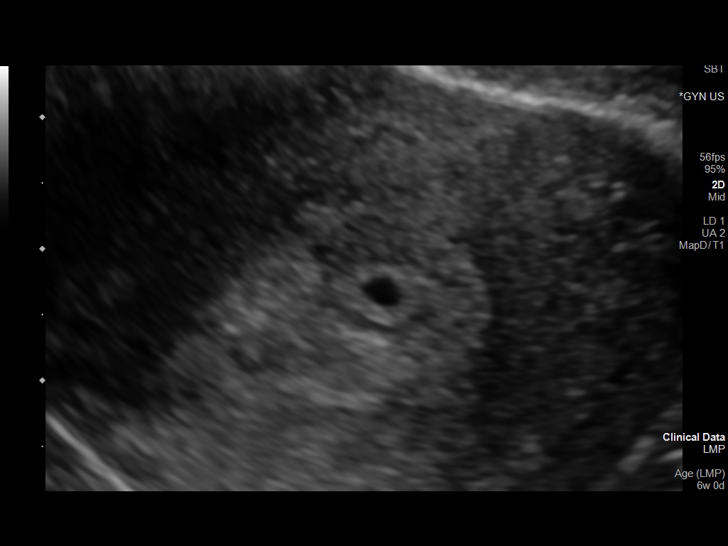
[im 14/25]
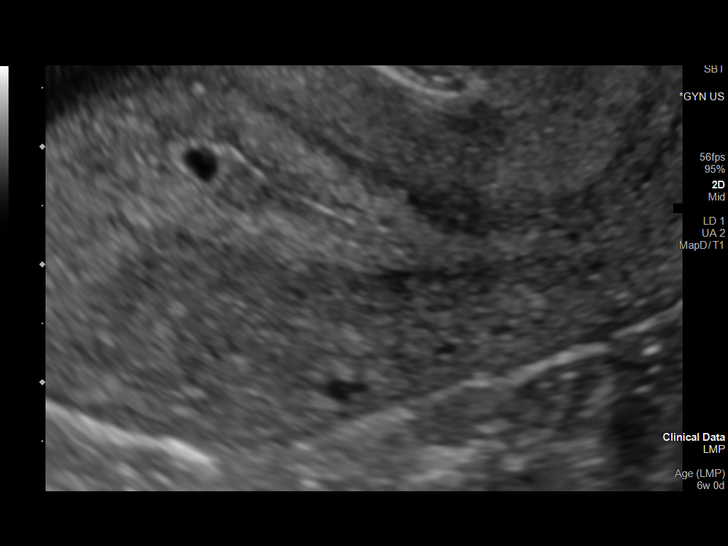
[im 16/25]
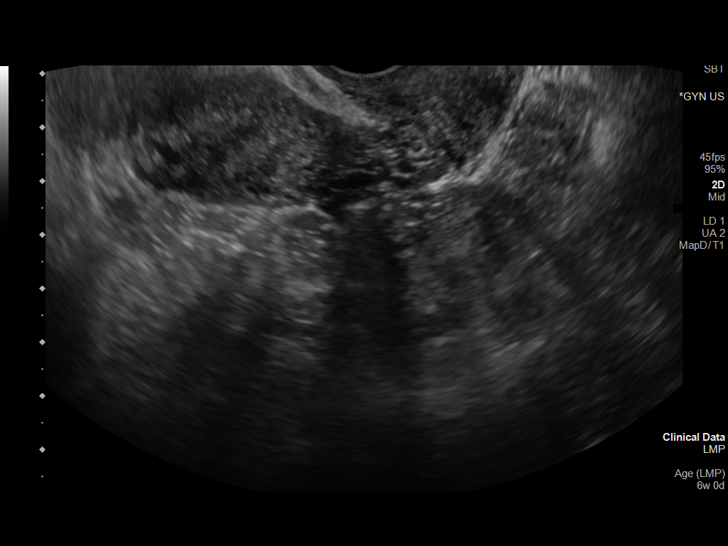
[im 17/25]
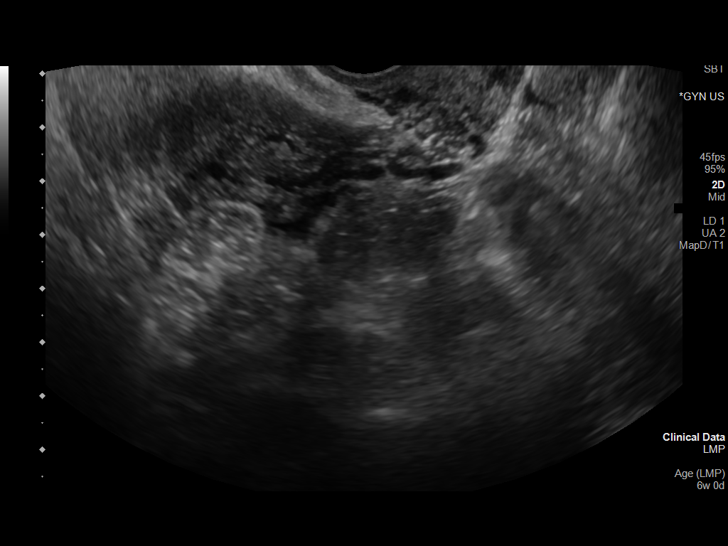
[im 19/25]
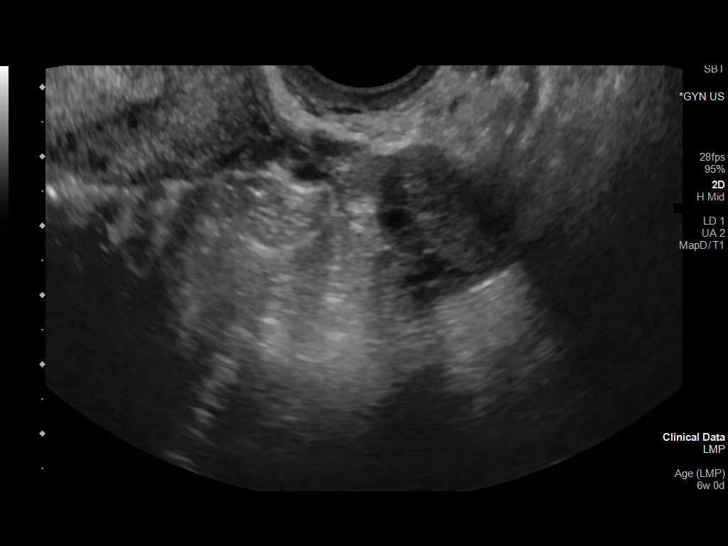
[im 21/25]
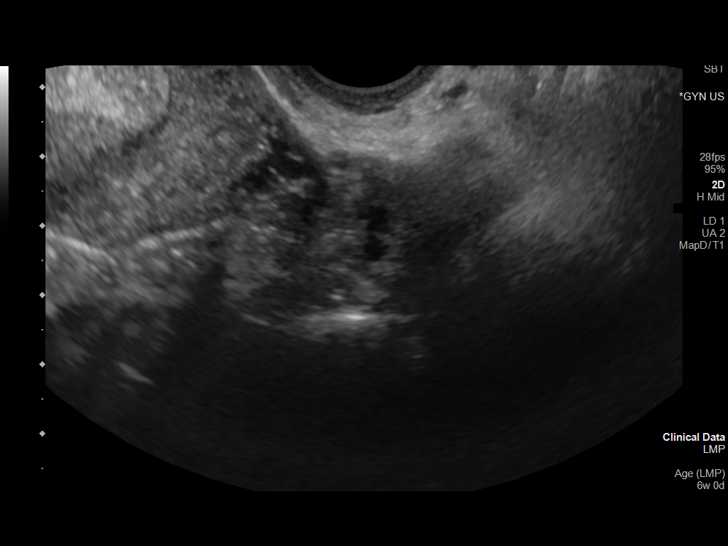
[im 23/25]
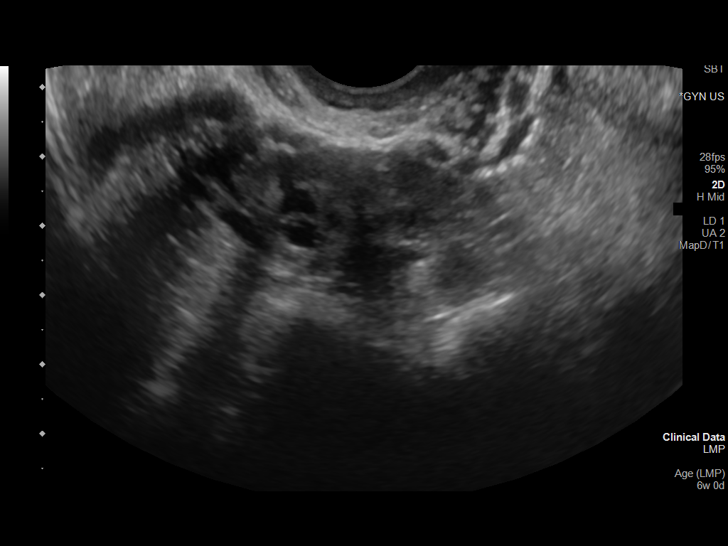
[im 25/25]
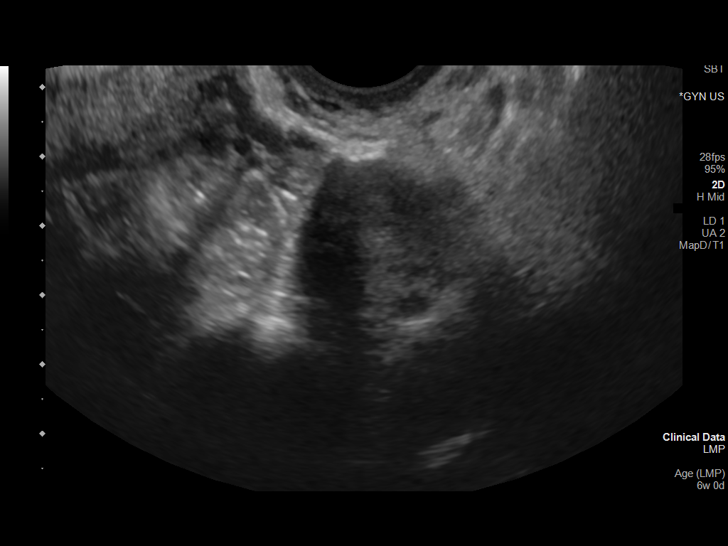

[14 of 25 positions shown; findings below may reference images not displayed]

FINDINGS: Intrauterine gestational sac: Single

Yolk sac:  Not visualized

Embryo:  Not visualized

Cardiac Activity: Not visualized

Heart Rate:   bpm

MSD: 2.9 mm   5 w   0 d

CRL:    mm    w    d                  US EDC:

Subchorionic hemorrhage:  None visualized.

Maternal uterus/adnexae: No adnexal mass or free fluid.
IMPRESSION: Probable early intrauterine gestational sac, but no yolk sac, fetal
pole, or cardiac activity yet visualized. Recommend follow-up
quantitative B-HCG levels and follow-up US in 14 days to assess
viability. This recommendation follows SRU consensus guidelines:
Diagnostic Criteria for Nonviable Pregnancy Early in the First
Trimester. N Engl J Med 8408; [DATE].

## 2020-10-14 IMAGING — US TRANSVAGINAL OB ULTRASOUND
1 series · 15 of 26 positions shown · non-contrast
Comparison: 02/04/2019

CLINICAL DATA: Vaginal bleeding

EXAM:
TRANSVAGINAL OB ULTRASOUND
TECHNIQUE: Transvaginal ultrasound was performed for complete evaluation of the
gestation as well as the maternal uterus, adnexal regions, and
pelvic cul-de-sac.

[Series 1: transvaginal ob ultrasound · 15 of 26 slices shown]
[im 1/26]
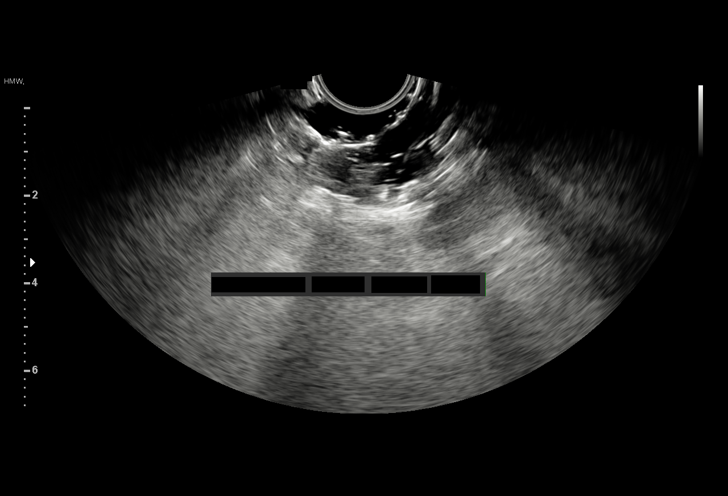
[im 3/26]
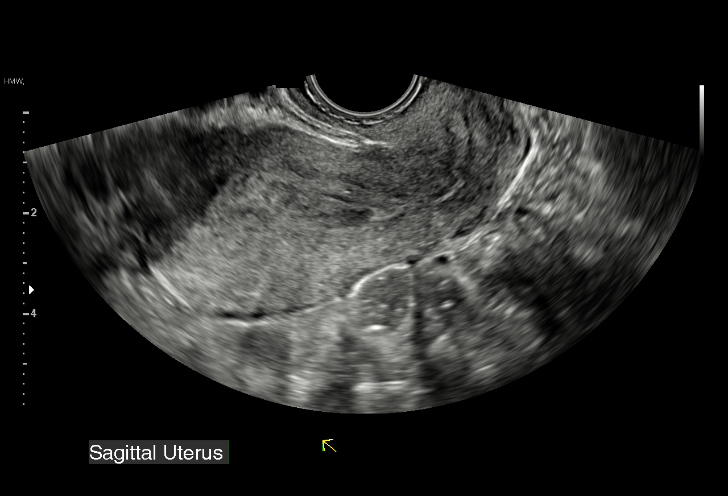
[im 5/26]
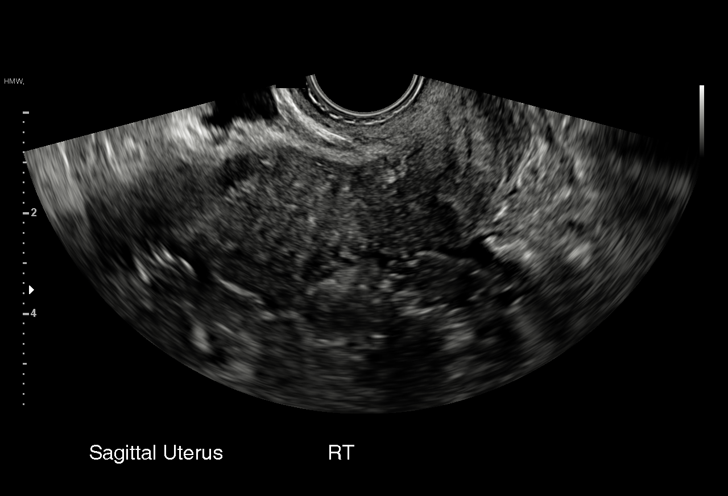
[im 7/26]
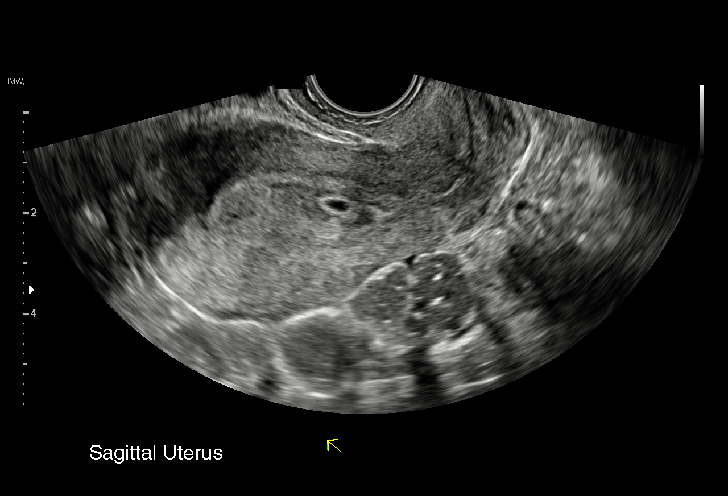
[im 8/26]
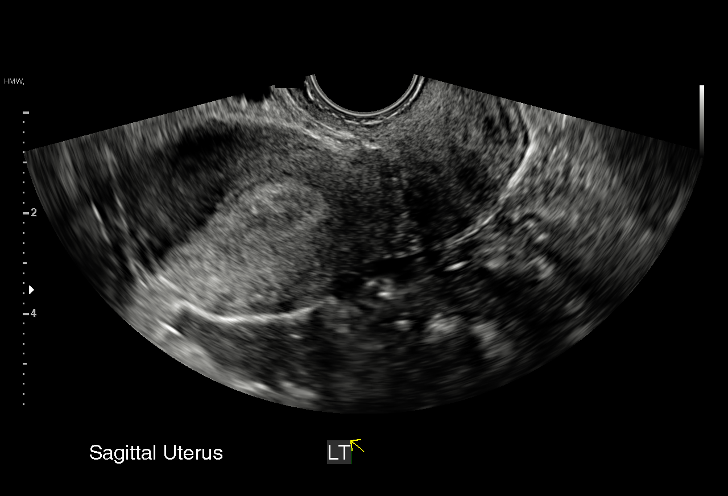
[im 10/26]
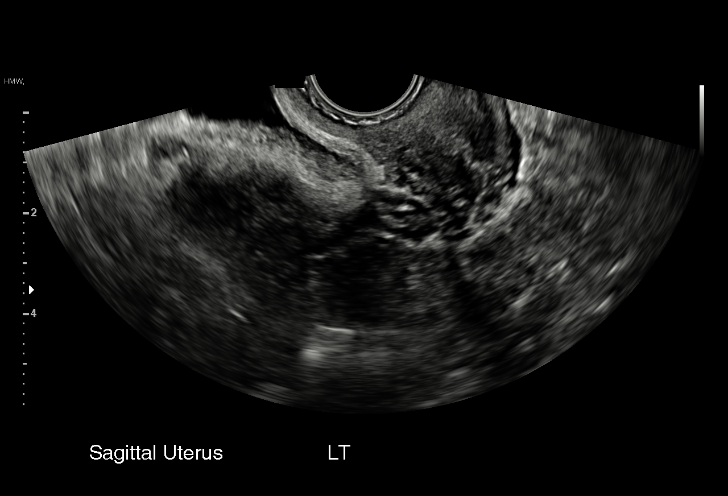
[im 12/26]
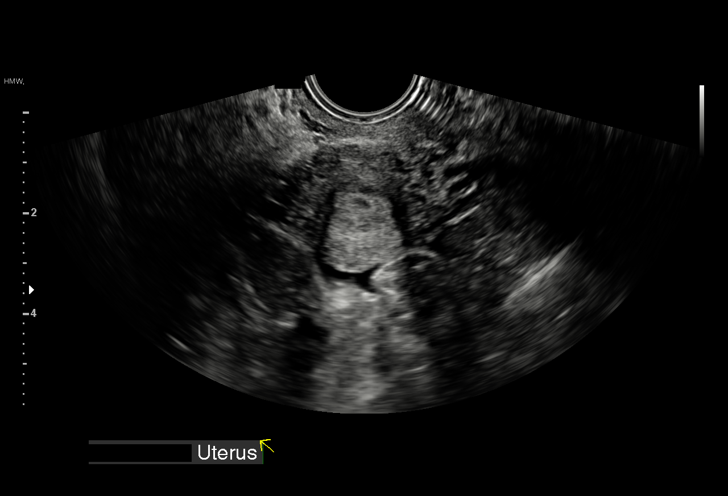
[im 14/26]
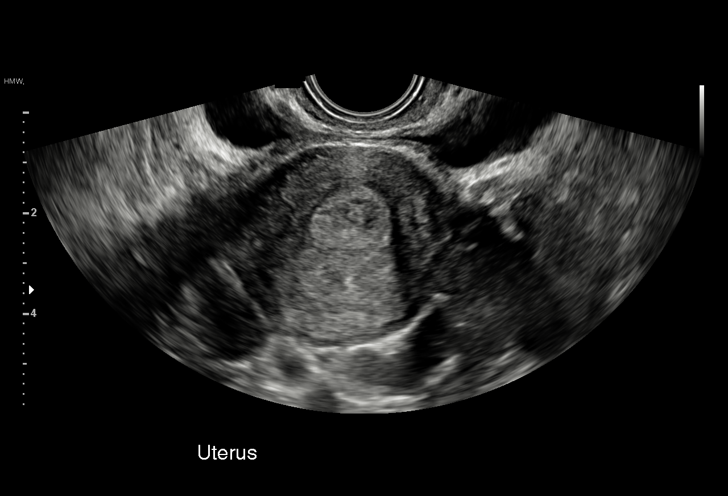
[im 15/26]
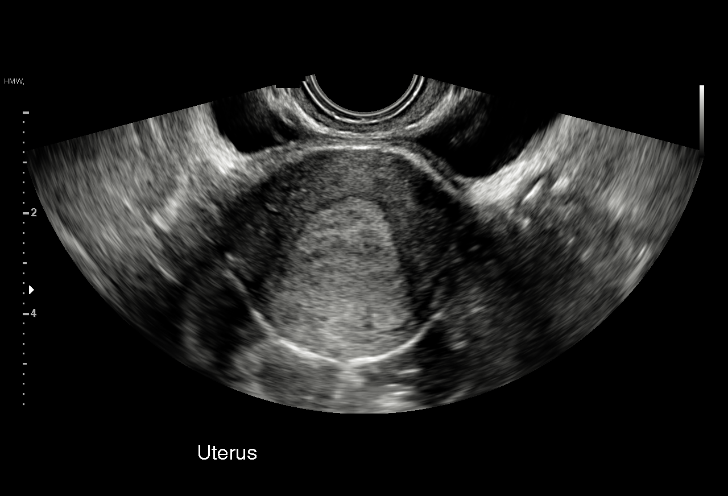
[im 17/26]
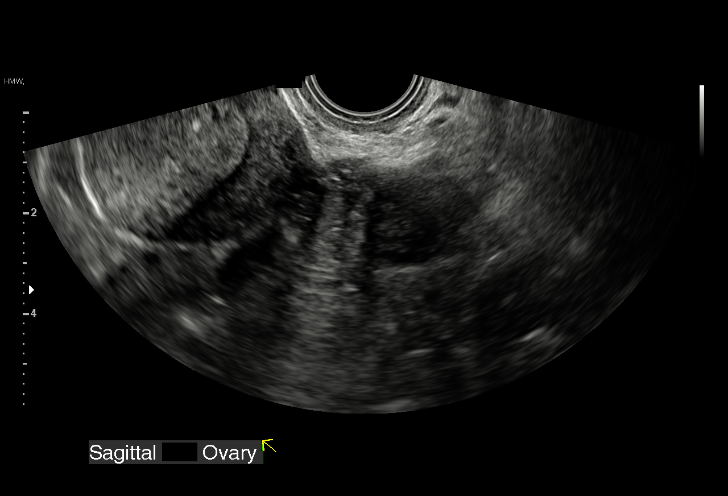
[im 19/26]
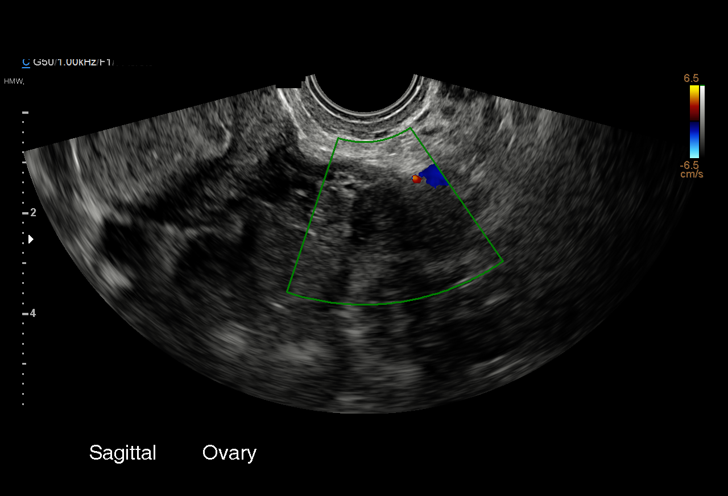
[im 20/26]
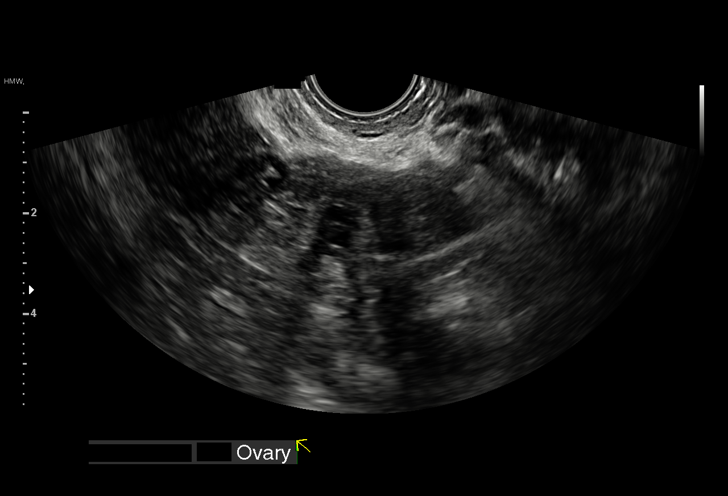
[im 22/26]
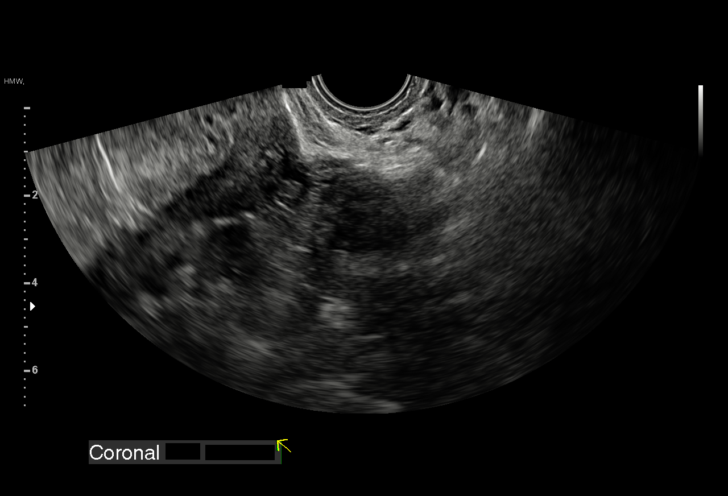
[im 24/26]
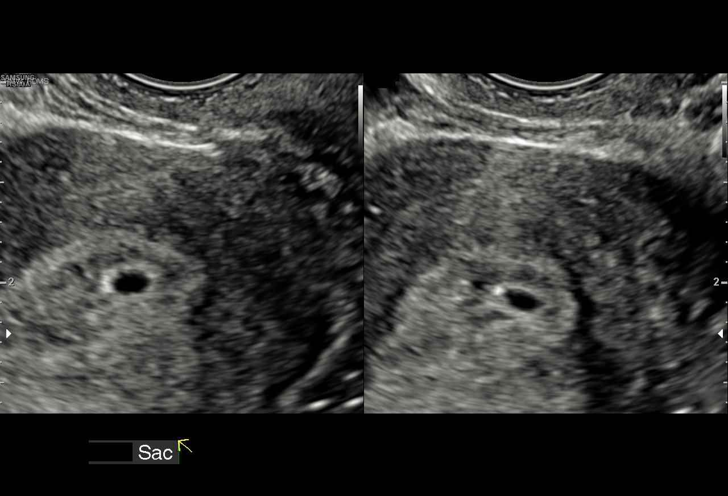
[im 26/26]
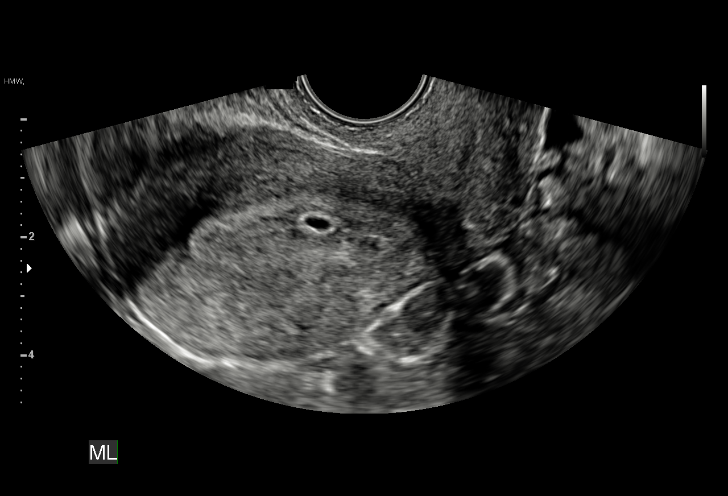

[15 of 26 positions shown; findings below may reference images not displayed]

FINDINGS: Intrauterine gestational sac: Single

Yolk sac:  Not visualized

Embryo:  Not visualized

Cardiac Activity: Not visualized

Heart Rate:  bpm

MSD: 3.44 mm   5 w   0 d

CRL:     mm    w  d                  US EDC:

Subchorionic hemorrhage:  None visualized.

Maternal uterus/adnexae: No adnexal mass or free fluid.
IMPRESSION: Probable early intrauterine gestational sac, but no yolk sac, fetal
pole, or cardiac activity yet visualized. Recommend follow-up
quantitative B-HCG levels and follow-up US in 14 days to assess
viability. This recommendation follows SRU consensus guidelines:
Diagnostic Criteria for Nonviable Pregnancy Early in the First
Trimester. N Engl J Med 9991; [DATE].

## 2023-02-20 ENCOUNTER — Other Ambulatory Visit: Payer: Self-pay | Admitting: Orthopedic Surgery

## 2023-04-01 ENCOUNTER — Encounter (HOSPITAL_BASED_OUTPATIENT_CLINIC_OR_DEPARTMENT_OTHER): Payer: Self-pay

## 2023-04-01 ENCOUNTER — Ambulatory Visit (HOSPITAL_BASED_OUTPATIENT_CLINIC_OR_DEPARTMENT_OTHER): Admit: 2023-04-01 | Payer: Medicaid Other | Admitting: Orthopedic Surgery

## 2023-04-01 SURGERY — CARPAL TUNNEL RELEASE
Anesthesia: Choice | Laterality: Right
